# Patient Record
Sex: Female | Born: 1955 | Race: White | Hispanic: No | Marital: Single | Smoking: Current every day smoker
Health system: Southern US, Community
[De-identification: ages and names within clinical notes are randomized; demographics above are authoritative.]

## PROBLEM LIST (undated history)

## (undated) DIAGNOSIS — E282 Polycystic ovarian syndrome: Secondary | ICD-10-CM

## (undated) DIAGNOSIS — F4310 Post-traumatic stress disorder, unspecified: Secondary | ICD-10-CM

## (undated) DIAGNOSIS — F329 Major depressive disorder, single episode, unspecified: Secondary | ICD-10-CM

## (undated) DIAGNOSIS — Z5900 Homelessness unspecified: Secondary | ICD-10-CM

## (undated) DIAGNOSIS — F32A Depression, unspecified: Secondary | ICD-10-CM

---
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
## 2018-11-23 ENCOUNTER — Emergency Department (HOSPITAL_COMMUNITY): Payer: Medicaid Other

## 2018-11-23 ENCOUNTER — Encounter (HOSPITAL_COMMUNITY): Payer: Self-pay | Admitting: Emergency Medicine

## 2018-11-23 ENCOUNTER — Other Ambulatory Visit: Payer: Self-pay

## 2018-11-23 ENCOUNTER — Emergency Department (HOSPITAL_COMMUNITY)
Admission: EM | Admit: 2018-11-23 | Discharge: 2018-11-23 | Disposition: A | Discharge status: Home / Self Care | Payer: Medicaid Other | Attending: Emergency Medicine | Admitting: Emergency Medicine

## 2018-11-23 DIAGNOSIS — F17200 Nicotine dependence, unspecified, uncomplicated: Secondary | ICD-10-CM | POA: Diagnosis not present

## 2018-11-23 DIAGNOSIS — Z590 Homelessness: Secondary | ICD-10-CM | POA: Diagnosis not present

## 2018-11-23 DIAGNOSIS — R079 Chest pain, unspecified: Secondary | ICD-10-CM | POA: Diagnosis present

## 2018-11-23 HISTORY — DX: Depression, unspecified: F32.A

## 2018-11-23 HISTORY — DX: Homelessness unspecified: Z59.00

## 2018-11-23 HISTORY — DX: Polycystic ovarian syndrome: E28.2

## 2018-11-23 HISTORY — DX: Post-traumatic stress disorder, unspecified: F43.10

## 2018-11-23 LAB — BASIC METABOLIC PANEL
Anion gap: 19 — ABNORMAL HIGH (ref 5–15)
BUN: 14 mg/dL (ref 6–20)
CO2: 17 mmol/L — ABNORMAL LOW (ref 22–32)
Calcium: 9.5 mg/dL (ref 8.9–10.3)
Chloride: 103 mmol/L (ref 98–111)
Creatinine, Ser: 0.86 mg/dL (ref 0.44–1.00)
GFR calc Af Amer: >60 mL/min (ref >60)
GFR calc non Af Amer: >60 mL/min (ref >60)
Glucose, Bld: 86 mg/dL (ref 70–99)
Potassium: 4.3 mmol/L (ref 3.5–5.1)
Sodium: 139 mmol/L (ref 135–145)

## 2018-11-23 LAB — TROPONIN I
Troponin I: <0.03 ng/mL (ref <0.03)
Troponin I: <0.03 ng/mL (ref <0.03)

## 2018-11-23 LAB — PROTIME-INR
INR: 1 (ref 0.8–1.2)
Prothrombin Time: 13.3 seconds (ref 11.4–15.2)

## 2018-11-23 LAB — CBC
HCT: 35.8 % — ABNORMAL LOW (ref 36.0–46.0)
Hemoglobin: 12.1 g/dL (ref 12.0–15.0)
MCH: 31.2 pg (ref 26.0–34.0)
MCHC: 33.8 g/dL (ref 30.0–36.0)
MCV: 92.3 fL (ref 80.0–100.0)
Platelets: 341 10*3/uL (ref 150–400)
RBC: 3.88 MIL/uL (ref 3.87–5.11)
RDW: 14 % (ref 11.5–15.5)
WBC: 12 10*3/uL — ABNORMAL HIGH (ref 4.0–10.5)
nRBC: 0 % (ref 0.0–0.2)

## 2018-11-23 LAB — D-DIMER, QUANTITATIVE: D-Dimer, Quant: 0.74 ug/mL-FEU — ABNORMAL HIGH (ref 0.00–0.50)

## 2018-11-23 MED ORDER — SODIUM CHLORIDE 0.9 % IV BOLUS
1000.0000 mL | Freq: Once | INTRAVENOUS | Status: AC
  Administered 2018-11-23: 1000 mL via INTRAVENOUS

## 2018-11-23 MED ORDER — IOHEXOL 350 MG/ML SOLN
80.0000 mL | Freq: Once | INTRAVENOUS | Status: AC | PRN
  Administered 2018-11-23: 80 mL via INTRAVENOUS

## 2018-11-23 MED ORDER — SODIUM CHLORIDE 0.9% FLUSH: 3.0000 mL | Freq: Once | INTRAVENOUS | Status: DC

## 2018-11-23 MED ORDER — SODIUM CHLORIDE 0.9 % IV BOLUS
1000.0000 mL | Freq: Once | INTRAVENOUS | Status: AC
  Administered 2018-11-23: 06:00:00 1000 mL via INTRAVENOUS

## 2018-11-23 NOTE — ED Notes (Signed)
Patient transported to X-ray 
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
## 2018-11-23 NOTE — ED Provider Notes (Signed)
[facility] EMERGENCY DEPARTMENT Provider Note   CSN: [id number] Arrival date & time: 11/23/18  0206    History   Chief Complaint Chief Complaint  Patient presents with   Chest Pain    HPI Pamela Andrews is a 63 y.o. female.     Patient presents to the emergency department with a chief complaint of chest pain.  She states that she began having central chest pain with some associated shortness of breath tonight just prior to arrival.  She denies having had these symptoms before.  Denies any heart problems.  She states that she may be anxious due to being homeless and going out in the cold.  She reports some dry cough.  Denies any subjective fevers or chills.  Was given aspirin and nitroglycerin by EMS with some relief.  Denies any other associated symptoms.  States that she does drink a lot of alcohol, smoked marijuana a few days ago, but denies any other drug use.  The history is provided by the patient. No language interpreter was used.    Past Medical History:  Diagnosis Date   Depression    Homelessness    PCOS (polycystic ovarian syndrome)    PTSD (post-traumatic stress disorder)     There are no active problems to display for this patient.   History reviewed. No pertinent surgical history.   OB History   No obstetric history on file.      Home Medications    Prior to Admission medications   Not on File    Family History No family history on file.  Social History Social History   Tobacco Use   Smoking status: Current Every Day Smoker   Smokeless tobacco: Never Used  Substance Use Topics   Alcohol use: Yes   Drug use: Yes    Types: Marijuana     Allergies   Patient has no known allergies.   Review of Systems Review of Systems  All other systems reviewed and are negative.    Physical Exam Updated Vital Signs BP (!) 120/98 (BP Location: Right Arm)    Pulse (!) 116    Resp 18    Ht  (1.676 m)    Wt 63.5 kg     SpO2 99%    BMI 22.60 kg/m   Physical Exam Vitals signs and nursing note reviewed.  Constitutional:      General: She is not in acute distress.    Appearance: She is well-developed.  HENT:     Head: Normocephalic and atraumatic.  Eyes:     Conjunctiva/sclera: Conjunctivae normal.  Neck:     Musculoskeletal: Neck supple.  Cardiovascular:     Rate and Rhythm: Normal rate and regular rhythm.     Heart sounds: No murmur.  Pulmonary:     Effort: Pulmonary effort is normal. No respiratory distress.     Breath sounds: Normal breath sounds.  Abdominal:     Palpations: Abdomen is soft.     Tenderness: There is no abdominal tenderness.  Musculoskeletal: Normal range of motion.  Skin:    General: Skin is warm and dry.  Neurological:     Mental Status: She is alert and oriented to person, place, and time.  Psychiatric:        Mood and Affect: Mood normal.        Behavior: Behavior normal.      ED Treatments / Results  Labs (all labs ordered are listed, but only abnormal results are  displayed) Labs Reviewed  BASIC METABOLIC PANEL - Abnormal; Notable for the following components:      Result Value   CO2 17 (*)    Anion gap 19 (*)    All other components within normal limits  CBC - Abnormal; Notable for the following components:   WBC 12.0 (*)    HCT 35.8 (*)    All other components within normal limits  D-DIMER, QUANTITATIVE (NOT AT [facility]) - Abnormal; Notable for the following components:   D-Dimer, Quant 0.74 (*)    All other components within normal limits  TROPONIN I  PROTIME-INR  TROPONIN I    EKG None  Radiology Dg Chest 2 View  Result Date: 11/23/2018 CLINICAL DATA:  63 y/o F; central chest pain with mild shortness of breath. EXAM: CHEST - 2 VIEW COMPARISON:  None. FINDINGS: Normal cardiac silhouette. Aortic atherosclerosis with calcification. Clear lungs. No pleural effusion or pneumothorax. Mild age indeterminate midthoracic compression deformity. IMPRESSION: 1.  No acute pulmonary process identified. 2.  Aortic Atherosclerosis (ICD10-I70.0). 3. Mild age indeterminate midthoracic vertebral body compression deformity. Electronically Signed   By: Mitzi Hansen M.D.   On: 11/23/2018 02:45   Ct Angio Chest Pe W And/or Wo Contrast  Result Date: 11/23/2018 CLINICAL DATA:  63 year old female with chest pain and shortness of breath onset tonight. EXAM: CT ANGIOGRAPHY CHEST WITH CONTRAST TECHNIQUE: Multidetector CT imaging of the chest was performed using the standard protocol during bolus administration of intravenous contrast. Multiplanar CT image reconstructions and MIPs were obtained to evaluate the vascular anatomy. CONTRAST:  28mL OMNIPAQUE IOHEXOL 350 MG/ML SOLN COMPARISON:  Chest radiographs earlier today. FINDINGS: Cardiovascular: Excellent contrast bolus timing in the pulmonary arterial tree. Respiratory motion in the basal segments of the lower lobes. No focal filling defect identified in the pulmonary arteries to suggest acute pulmonary embolism. Calcified coronary artery atherosclerosis (series 8, image 209). Calcified aortic atherosclerosis. No cardiomegaly or pericardial effusion. Mediastinum/Nodes: Negative, no lymphadenopathy. Lungs/Pleura: Major airways are patent. No consolidation or pleural effusion. Occasional mild pulmonary scarring, minimal dependent atelectasis. Upper Abdomen: Negative visible liver, spleen, pancreas, adrenal glands, kidneys, and bowel in the upper abdomen. Musculoskeletal: Moderate T7 compression fracture with anterior wedging is sclerotic and has a chronic appearance by CT. Underlying osteopenia. No acute osseous abnormality identified. Review of the MIP images confirms the above findings. IMPRESSION: 1. Negative for acute pulmonary embolus. 2. Calcified coronary artery and Aortic Atherosclerosis (ICD10-I70.0). 3. No acute pulmonary findings. 4. Osteopenia with T7 compression fracture favored to be chronic. Electronically  Signed   By: Odessa Fleming M.D.   On: 11/23/2018 03:55    Procedures Procedures (including critical care time)  Medications Ordered in ED Medications  sodium chloride flush (NS) 0.9 % injection 3 mL (3 mLs Intravenous Not Given 11/23/18 0218)     Initial Impression / Assessment and Plan / ED Course  I have reviewed the triage vital signs and the nursing notes.  Pertinent labs & imaging results that were available during my care of the patient were reviewed by me and considered in my medical decision making (see chart for details).        Patient with chest pain.  Onset just prior to arrival.  No hx of cardiac disease.  Risk factors include smoking hx.  Denies any new cough or SOB.  Afebrile.  Tachy in triage, could be dehydrated.  She is homeless and a heavy drinker.  Will give fluids and check d-dimer due to chest pain.  Dimer  is high.  CT PE study ordered.  Negative for PE.  Old compression fracture seen.  Labs are otherwise reassuring.  Repeat troponin is negative.  Doubt ACS.  DC to home.  Final Clinical Impressions(s) / ED Diagnoses   Final diagnoses:  Nonspecific chest pain    ED Discharge Orders    None       Felipa FurnaceBrowning, Phung Kotas, PA-C 11/23/18 [id number]    Dione BoozeGlick, David, MD 11/23/18 [id number] [id number]    Dione BoozeGlick, David, MD 11/23/18 [id number]

## 2018-11-23 NOTE — ED Notes (Signed)
Nurse called lab to follow up pt.'s 2nd Troponin result , lab tech advised nurse that it is in process.

## 2020-02-03 IMAGING — CT CT ANGIOGRAPHY CHEST
2 of 7 series · 19 of 46 positions shown · IV contrast (omnipaque)
Comparison: Chest radiographs earlier today.

CLINICAL DATA: 62-year-old female with chest pain and shortness of
breath onset tonight.

EXAM:
CT ANGIOGRAPHY CHEST WITH CONTRAST
TECHNIQUE: Multidetector CT imaging of the chest was performed using the
standard protocol during bolus administration of intravenous
contrast. Multiplanar CT image reconstructions and MIPs were
obtained to evaluate the vascular anatomy.
CONTRAST:  80mL OMNIPAQUE IOHEXOL 350 MG/ML SOLN

[Series 8: thins · axial · 0.69mm/px · z∈[+1069,+1348]mm · 16 of 449 slices shown]
[im 25/449  lung]
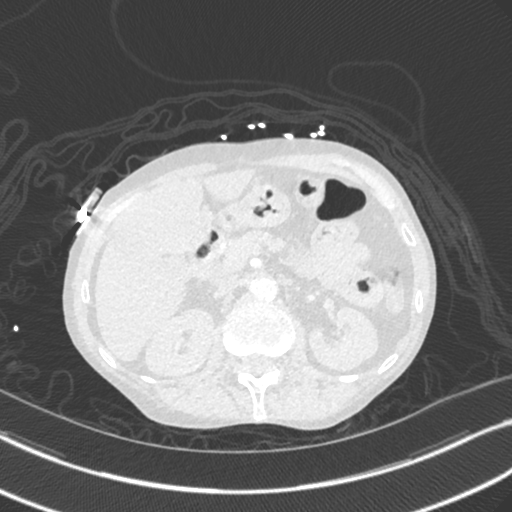
[im 50/449  soft-tissue]
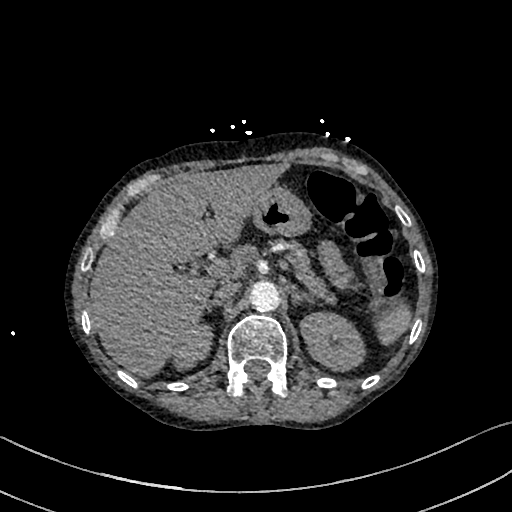
[im 75/449  lung]
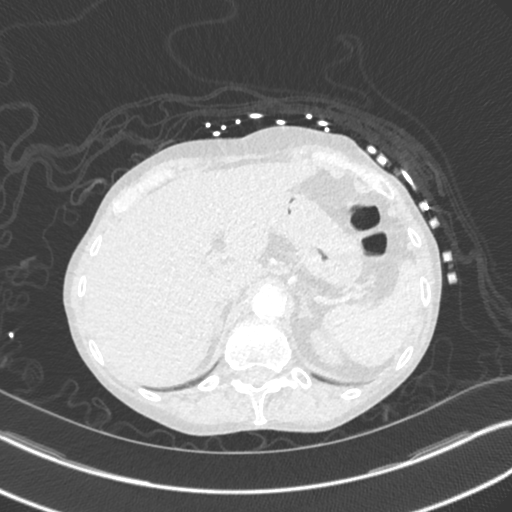
[im 100/449  soft-tissue]
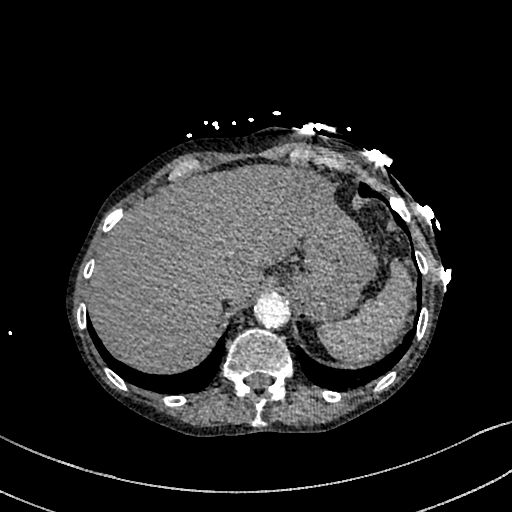
[im 125/449  lung]
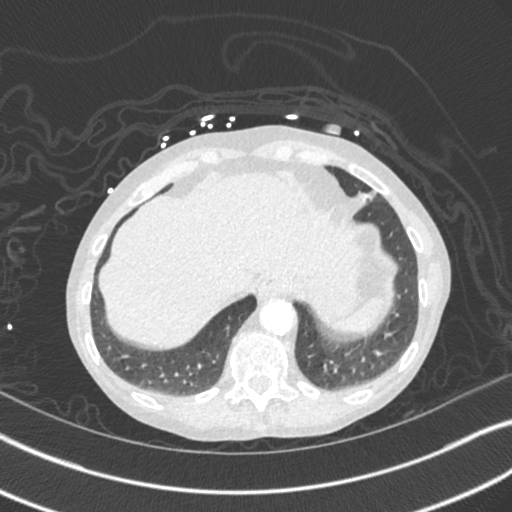
[im 150/449  soft-tissue]
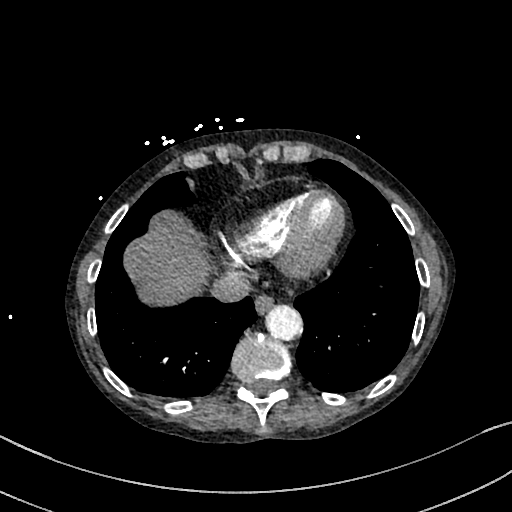
[im 175/449  lung]
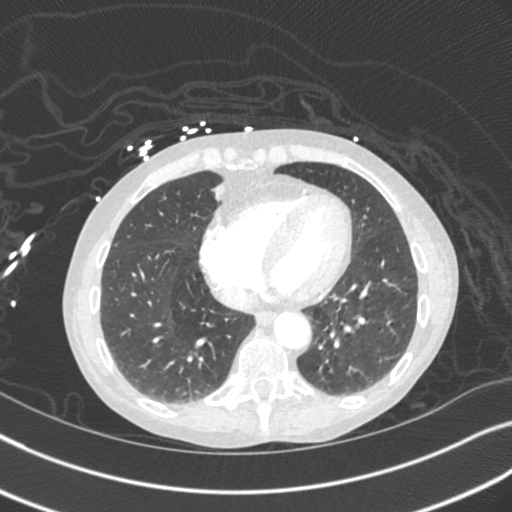
[im 200/449  soft-tissue]
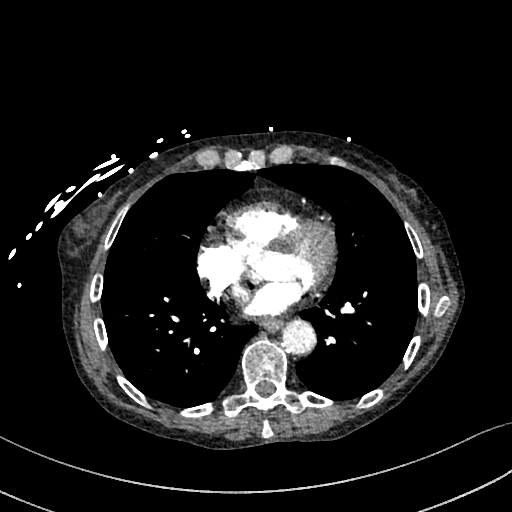
[im 249/449  lung]
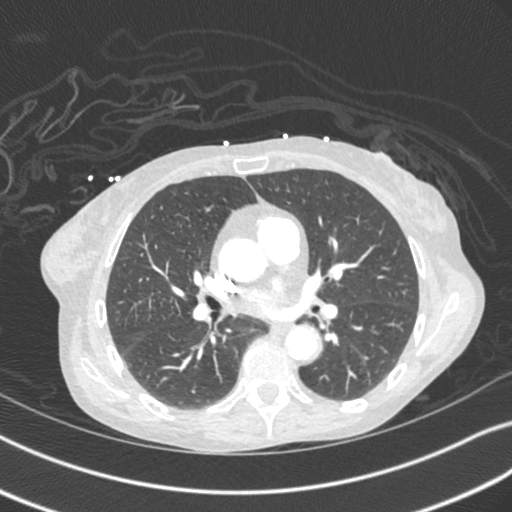
[im 274/449  soft-tissue]
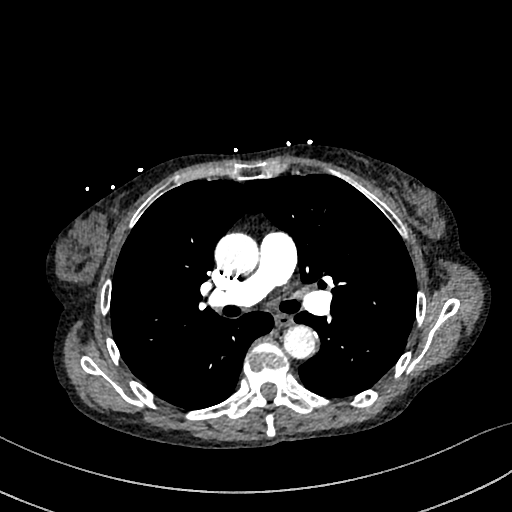
[im 299/449  lung]
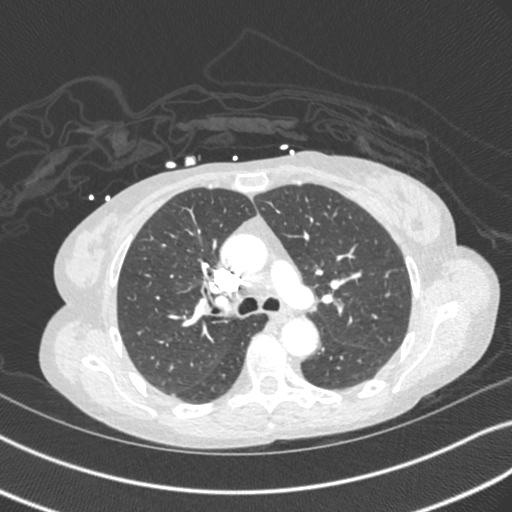
[im 324/449  soft-tissue]
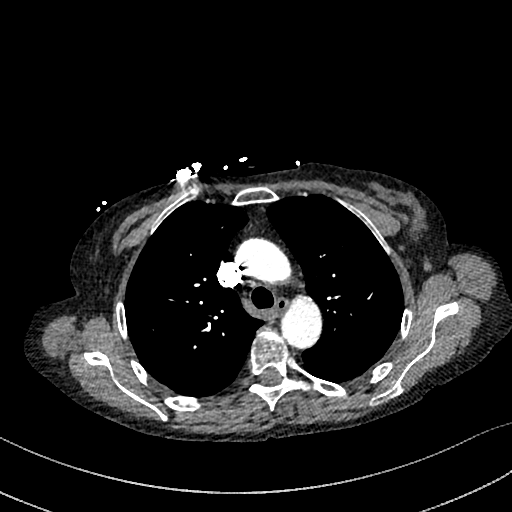
[im 349/449  lung]
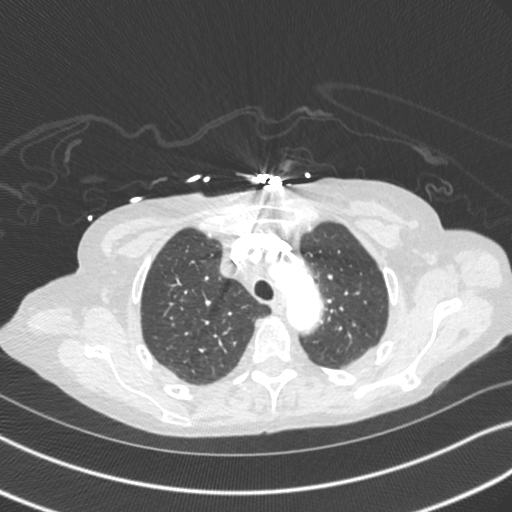
[im 374/449  soft-tissue]
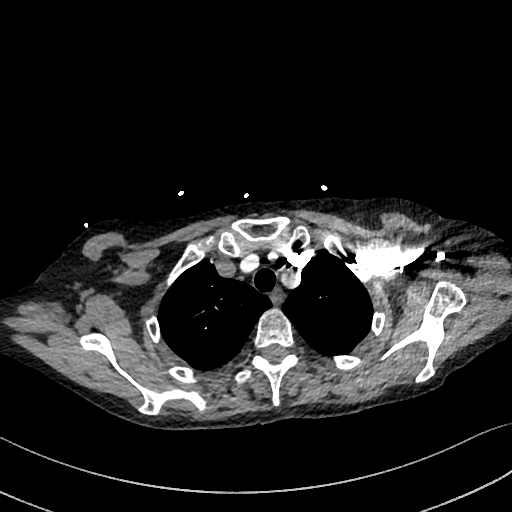
[im 399/449  lung]
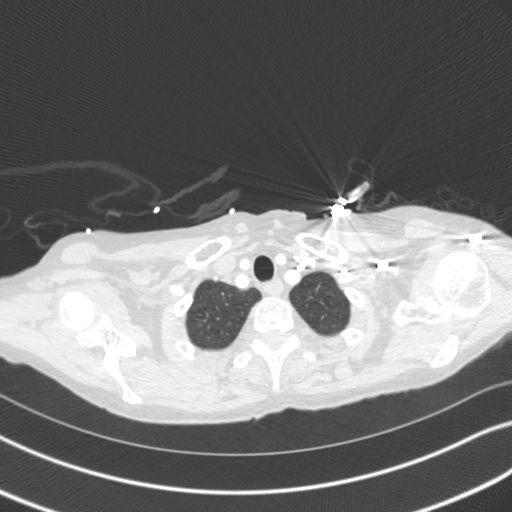
[im 424/449  soft-tissue]
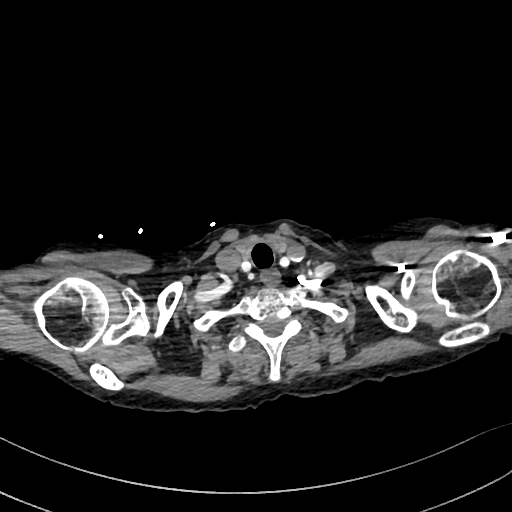

[Series 9: cor · coronal · 0.62mm/px · 3 of 122 slices shown]
[im 31/122  soft-tissue]
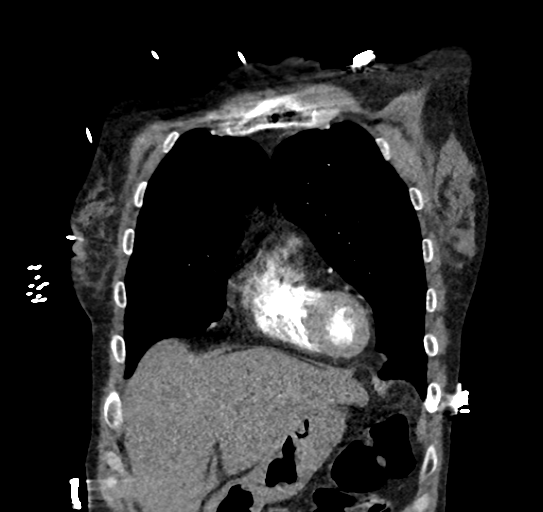
[im 61/122  soft-tissue]
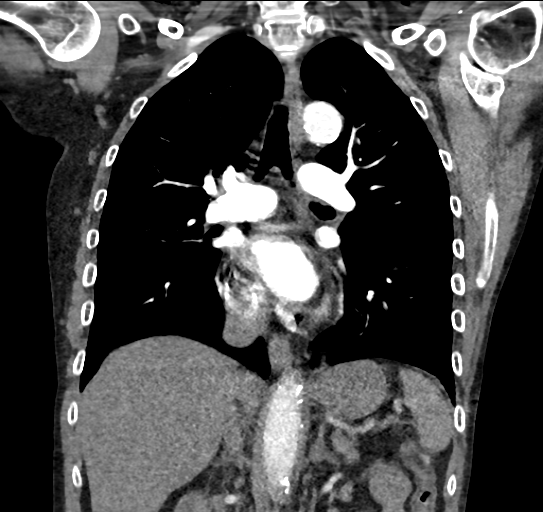
[im 91/122  soft-tissue]
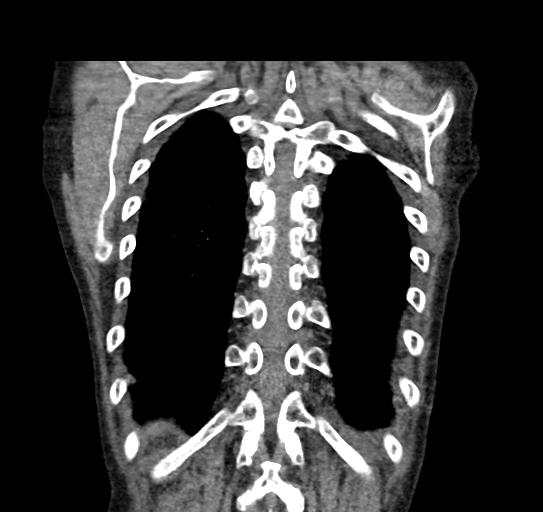

[19 of 46 positions shown; findings below may reference images not displayed]

FINDINGS: Cardiovascular: Excellent contrast bolus timing in the pulmonary
arterial tree. Respiratory motion in the basal segments of the lower
lobes.

No focal filling defect identified in the pulmonary arteries to
suggest acute pulmonary embolism.

Calcified coronary artery atherosclerosis (series 8, image 209).
Calcified aortic atherosclerosis. No cardiomegaly or pericardial
effusion.

Mediastinum/Nodes: Negative, no lymphadenopathy.

Lungs/Pleura: Major airways are patent. No consolidation or pleural
effusion. Occasional mild pulmonary scarring, minimal dependent
atelectasis.

Upper Abdomen: Negative visible liver, spleen, pancreas, adrenal
glands, kidneys, and bowel in the upper abdomen.

Musculoskeletal: Moderate T7 compression fracture with anterior
wedging is sclerotic and has a chronic appearance by CT. Underlying
osteopenia. No acute osseous abnormality identified.

Review of the MIP images confirms the above findings.
IMPRESSION: 1. Negative for acute pulmonary embolus.
2. Calcified coronary artery and Aortic Atherosclerosis
([id number]).
3. No acute pulmonary findings.
4. Osteopenia with T7 compression fracture favored to be chronic.
